# Patient Record
Sex: Female | Born: 1975 | Hispanic: Yes | Marital: Single
Health system: Southern US, Community
[De-identification: ages and names within clinical notes are randomized; demographics above are authoritative.]

---

## 2019-01-03 ENCOUNTER — Encounter: Payer: Self-pay | Admitting: *Deleted

## 2019-01-03 ENCOUNTER — Ambulatory Visit: Payer: Self-pay | Attending: Oncology | Admitting: *Deleted

## 2019-01-03 ENCOUNTER — Encounter (INDEPENDENT_AMBULATORY_CARE_PROVIDER_SITE_OTHER): Payer: Self-pay

## 2019-01-03 ENCOUNTER — Ambulatory Visit
Admission: RE | Admit: 2019-01-03 | Discharge: 2019-01-03 | Disposition: A | Discharge status: Home / Self Care | Payer: Self-pay | Source: Ambulatory Visit | Attending: Oncology | Admitting: Oncology

## 2019-01-03 VITALS — BP 121/80 | HR 66 | Temp 97.9°F | Ht 63.25 in | Wt 212.6 lb

## 2019-01-03 DIAGNOSIS — N63 Unspecified lump in unspecified breast: Secondary | ICD-10-CM

## 2019-01-03 NOTE — Patient Instructions (Signed)
Gave patient hand-out, Women Staying Healthy, Active and Well from [facility], with education on breast health, pap smears, heart and colon health. 

## 2019-01-03 NOTE — Progress Notes (Signed)
  Subjective:     Patient ID: Karen Baxter, female   DOB: November 20, 1976, 43 y.o.   MRN: [id number]  HPI   Review of Systems     Objective:   Physical Exam Chest:          Assessment:     43 year old Hispanic female referred to [facility] by [facility] for complaints of an enlarging right breast mass.  Karen Baxter, the interpreter present during the interview and exam.  On clinical breast exam there is glandular like tissue at bilateral inner lower quadrants.  I asked the patient to point the area of concern which was at 5:00 right breast at the edge of the inframammary ridge.  I can palpate soft mobile glandular like tissue at the site of concern.  Taught self breast awareness.  Patient states she had her repeat pap in November and it was normal.  Next pap due per [facility] guidelines.  Patient has been screened for eligibility.  She does not have any insurance, Medicare or Medicaid.  She also meets financial eligibility.  Hand-out given on the Affordable Care Act. Risk Assessment    Risk Scores      01/03/2019   Last edited by: Alta Corning, CMA   5-year risk: 0.4 %   Lifetime risk: 7.9 %            Plan:     Ordered bilateral diagnostic mammogram and ultrasound to assess the patient's area of concern at 5:00 right breast.  nformed patient that the area of concern felt like normal glandular tissue, and if no findings on imaging she can return in 1 year for annual screening, unless the area of concern gets larger or more painful, then she is to call us back and we will reassess her at that time.  Will follow-up per [facility] protocol.

## 2019-01-09 ENCOUNTER — Encounter: Payer: Self-pay | Admitting: *Deleted

## 2019-01-09 NOTE — Progress Notes (Signed)
Letter mailed from the [facility] to inform patient of her normal mammogram results.  Patient is to follow-up with annual screening in one year.  HSIS to [facility]. 

## 2019-05-23 ENCOUNTER — Telehealth: Payer: Self-pay

## 2019-05-23 ENCOUNTER — Other Ambulatory Visit: Payer: Self-pay

## 2019-05-23 DIAGNOSIS — Z20822 Contact with and (suspected) exposure to covid-19: Secondary | ICD-10-CM

## 2019-05-23 NOTE — Telephone Encounter (Signed)
Patient called using [facility] [phone], advised of the request for covid testing, she verbalized understanding. Appointment scheduled for today at 1500 at [facility], advised of location and to wear a mask for everyone in the vehicle, she verbalized understanding. Order placed.   Referred by Kerri Perches, PA at [place]

## 2019-05-30 LAB — NOVEL CORONAVIRUS, NAA

## 2020-03-07 ENCOUNTER — Ambulatory Visit: Payer: Self-pay | Attending: Internal Medicine

## 2020-03-07 DIAGNOSIS — Z23 Encounter for immunization: Secondary | ICD-10-CM

## 2020-03-07 NOTE — Progress Notes (Signed)
   Covid-19 Vaccination Clinic  Name:  Tyshawna Alarid    MRN: [id number] DOB: 1976/04/22  03/07/2020  Ms. Randell Detter was observed post Covid-19 immunization for 15 minutes without incident. She was provided with Vaccine Information Sheet and instruction to access the V-Safe system.   Ms. Darcia Lampi was instructed to call 911 with any severe reactions post vaccine: Marland Kitchen Difficulty breathing  . Swelling of face and throat  . A fast heartbeat  . A bad rash all over body  . Dizziness and weakness   Immunizations Administered    Name Date Dose VIS Date Route   Pfizer COVID-19 Vaccine 03/07/2020  5:08 PM 0.3 mL 11/23/2019 Intramuscular   Manufacturer: [employer], [employer]   Lot: [id number]   NDC: [address]-0

## 2020-03-28 ENCOUNTER — Ambulatory Visit: Payer: Self-pay | Attending: Internal Medicine

## 2020-03-28 DIAGNOSIS — Z23 Encounter for immunization: Secondary | ICD-10-CM

## 2020-03-28 NOTE — Progress Notes (Signed)
   Covid-19 Vaccination Clinic  Name:  Laguana Desautel    MRN: [id number] DOB: Apr 07, 1976  03/28/2020  Ms. Bayyinah Dukeman was observed post Covid-19 immunization for 15 minutes without incident. She was provided with Vaccine Information Sheet and instruction to access the V-Safe system.   Ms. Neeya Prigmore was instructed to call 911 with any severe reactions post vaccine: Marland Kitchen Difficulty breathing  . Swelling of face and throat  . A fast heartbeat  . A bad rash all over body  . Dizziness and weakness   Immunizations Administered    Name Date Dose VIS Date Route   Pfizer COVID-19 Vaccine 03/28/2020  3:06 PM 0.3 mL 11/23/2019 Intramuscular   Manufacturer: [employer], [employer]   Lot: [id number]   NDC: [address]-5      Covid-19 Vaccination Clinic  Name:  Laray Rivkin    MRN: [id number] DOB: 15-Aug-1976  03/28/2020  Ms. Rashunda Passon was observed post Covid-19 immunization for 15 minutes without incident. She was provided with Vaccine Information Sheet and instruction to access the V-Safe system.   Ms. Venna Berberich was instructed to call 911 with any severe reactions post vaccine: Marland Kitchen Difficulty breathing  . Swelling of face and throat  . A fast heartbeat  . A bad rash all over body  . Dizziness and weakness   Immunizations Administered    Name Date Dose VIS Date Route   Pfizer COVID-19 Vaccine 03/28/2020  3:06 PM 0.3 mL 11/23/2019 Intramuscular   Manufacturer: [employer], [employer]   Lot: [id number]   NDC: [address]-4

## 2020-06-14 IMAGING — MG DIGITAL DIAGNOSTIC BILATERAL MAMMOGRAM WITH TOMO AND CAD
6 of 10 series · 6 of 30 positions shown · non-contrast
Comparison: None.

CLINICAL DATA: Patient presents for bilateral diagnostic
examination due to a palpable abnormality for 1 year over the inner
lower quadrant of the right breast. This is patient's baseline
mammogram.

EXAM:
DIGITAL DIAGNOSTIC bilateral MAMMOGRAM WITH CAD AND TOMO
ULTRASOUND right BREAST

[R MLO synth-2D]
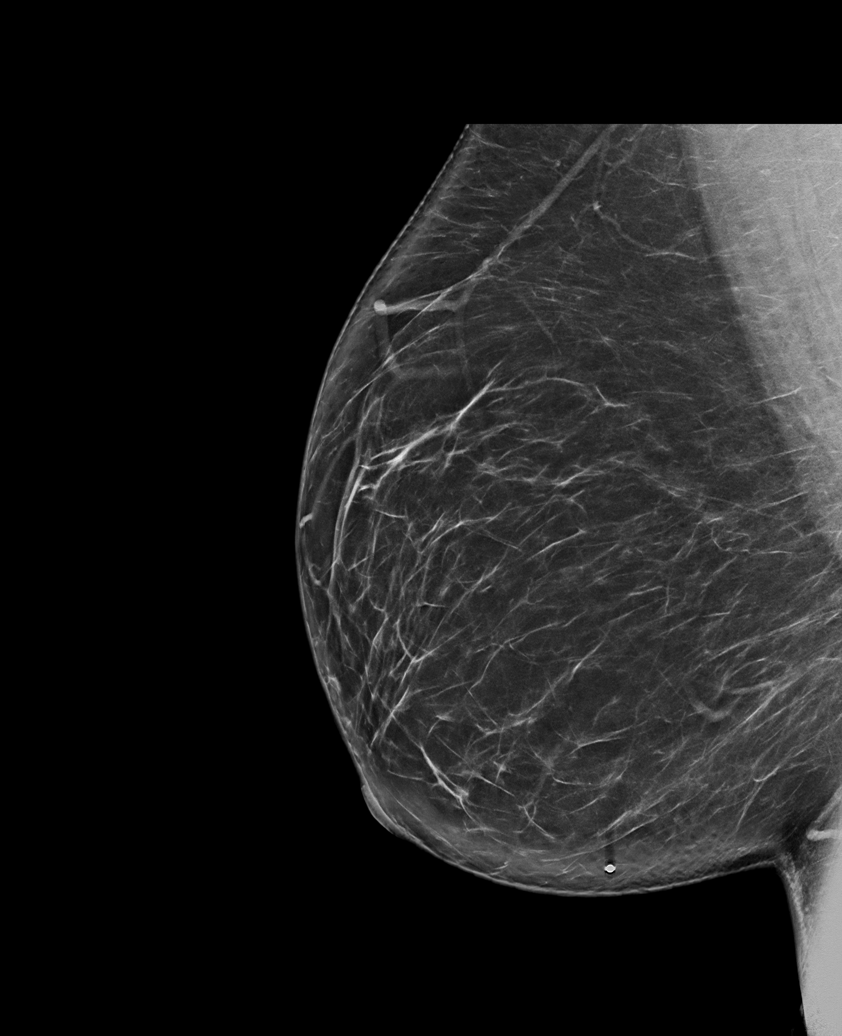

[R CC synth-2D]
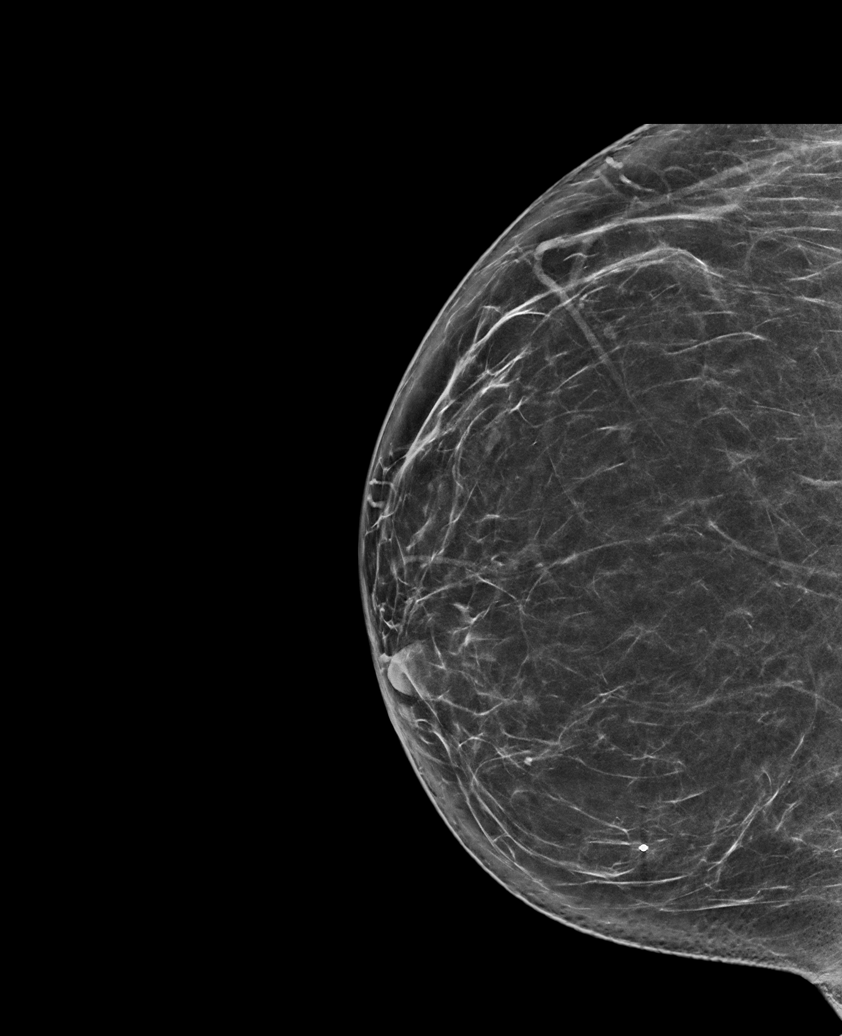

[R TAN synth-2D]
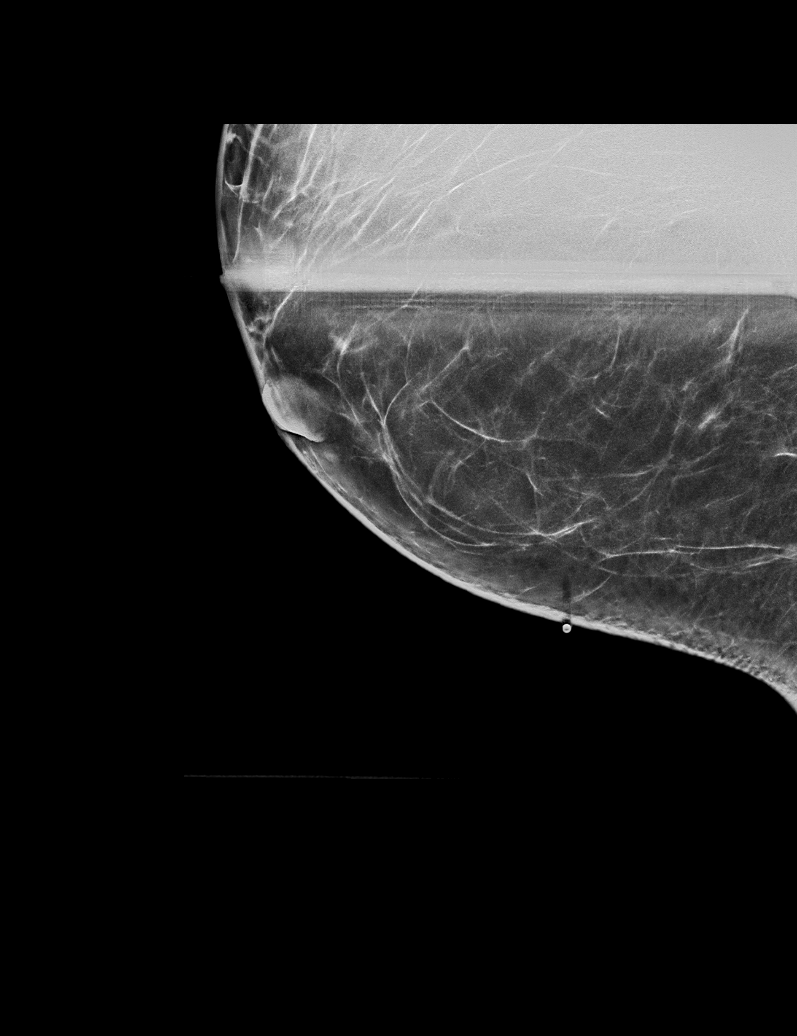

[L CC synth-2D]
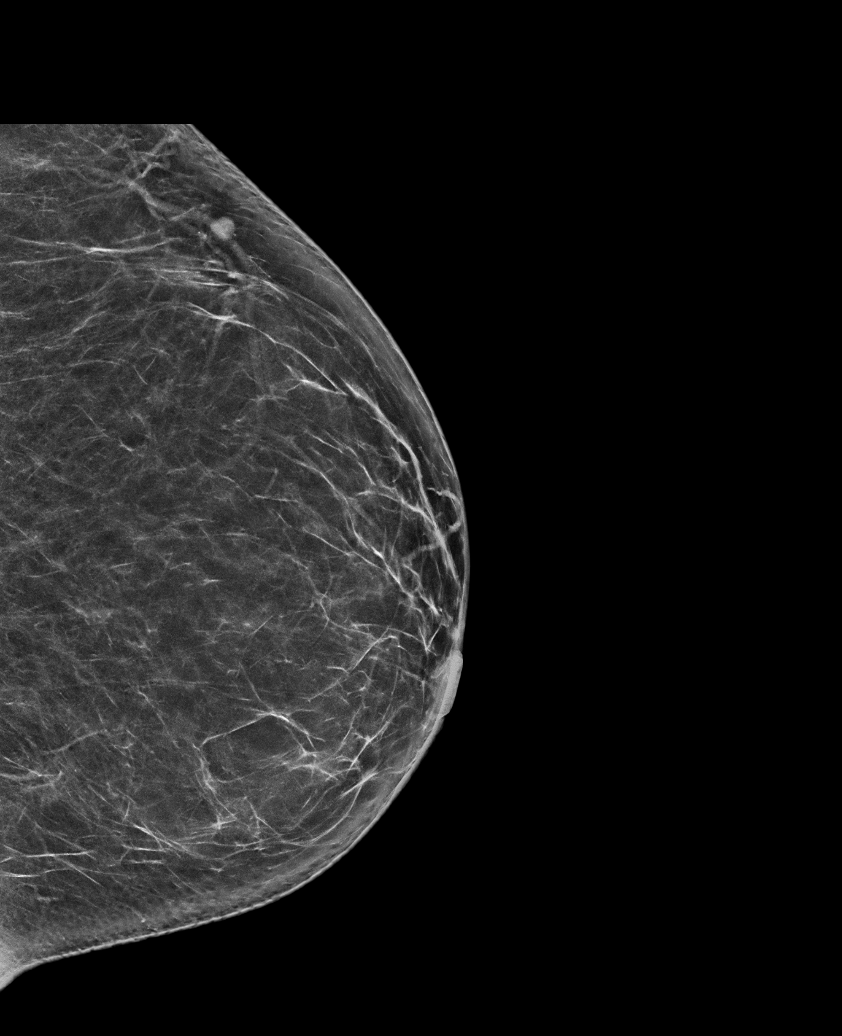

[L MLO synth-2D]
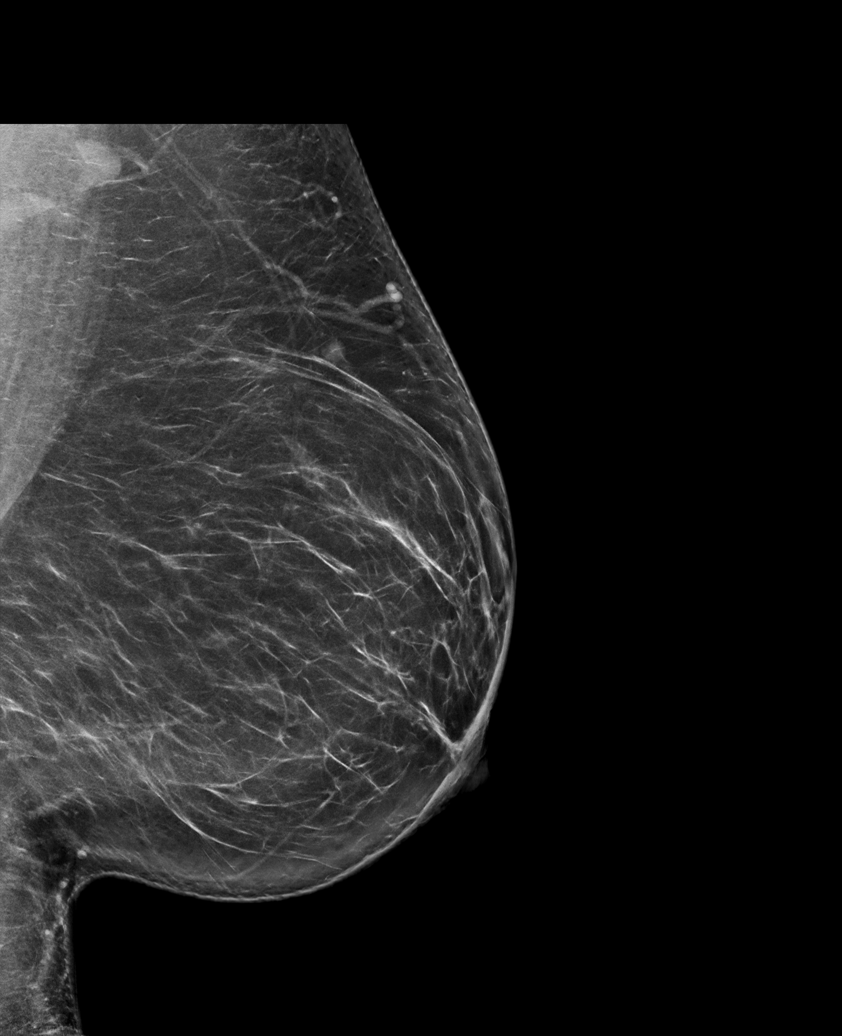

[L MLO tomo · tomo slice 46/91.0]
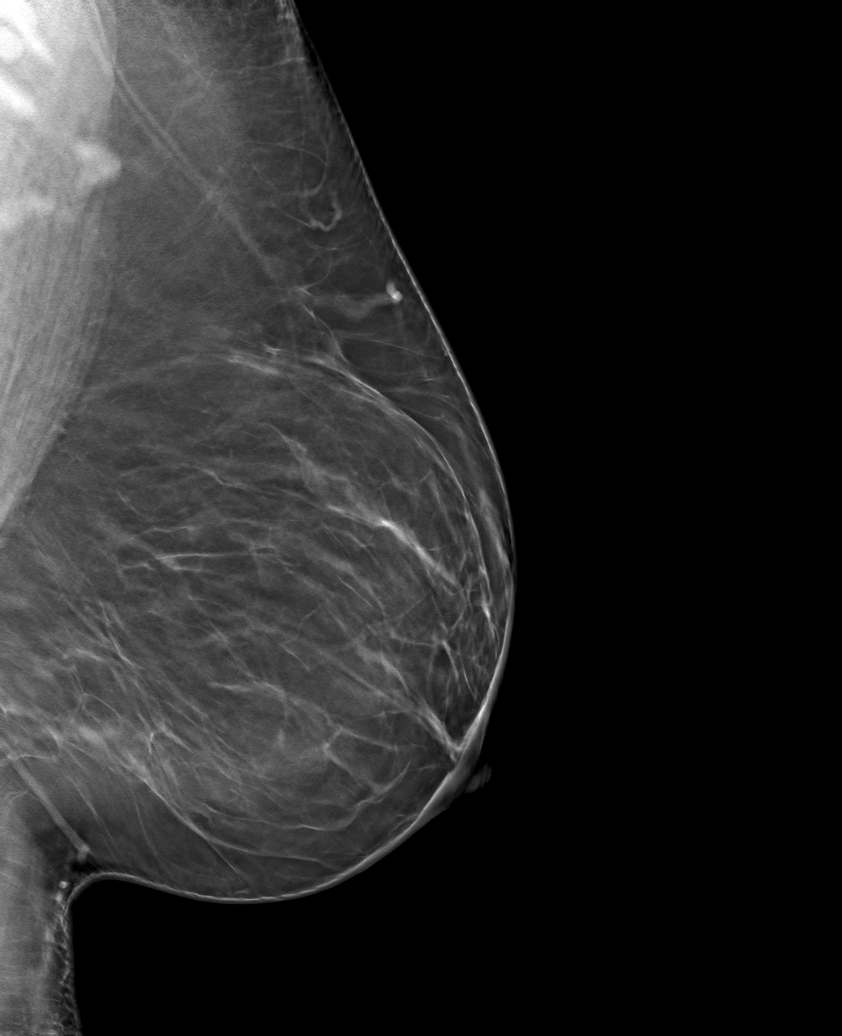

[6 of 30 positions shown; findings below may reference images not displayed]

ACR Breast Density Category b: There are scattered areas of
fibroglandular density.
FINDINGS: Exam demonstrates no focal abnormality over the inner lower quadrant
of the right breast to account for patient's palpable abnormality.
There is a small intramammary lymph node over the upper-outer left
breast. Remainder of the right breast as well as the left breast is
unremarkable.

Mammographic images were processed with CAD.

On physical exam, I palpate no focal abnormality over the inner
lower quadrant of the right breast.

Targeted ultrasound is performed, showing no focal abnormality over
the inner lower quadrant of the right breast to account for
patient's palpable abnormality.
IMPRESSION: No focal abnormality over the inner lower quadrant of the right
breast to account for patient's palpable abnormality. Remainder of
the exam is normal.

RECOMMENDATION:
Recommend continued management of patient's right breast palpable
abnormality on a clinical basis. Otherwise, recommend continued
annual bilateral screening mammographic follow-up.

I have discussed the findings and recommendations with the patient.
Results were also provided in writing at the conclusion of the
visit. If applicable, a reminder letter will be sent to the patient
regarding the next appointment.

BI-RADS CATEGORY  2: Benign.

## 2022-11-01 ENCOUNTER — Other Ambulatory Visit: Payer: Self-pay | Admitting: Family Medicine

## 2022-11-01 DIAGNOSIS — M25512 Pain in left shoulder: Secondary | ICD-10-CM

## 2022-11-03 ENCOUNTER — Ambulatory Visit
Admission: RE | Admit: 2022-11-03 | Discharge: 2022-11-03 | Disposition: A | Discharge status: Home / Self Care | Payer: Commercial Managed Care - HMO | Source: Ambulatory Visit | Attending: Family Medicine | Admitting: Family Medicine

## 2022-11-03 ENCOUNTER — Ambulatory Visit: Admission: RE | Admit: 2022-11-03 | Payer: Commercial Managed Care - HMO | Source: Ambulatory Visit

## 2022-11-03 DIAGNOSIS — M25512 Pain in left shoulder: Secondary | ICD-10-CM | POA: Insufficient documentation

## 2023-09-15 ENCOUNTER — Encounter: Payer: Self-pay | Admitting: Family Medicine

## 2023-11-16 ENCOUNTER — Other Ambulatory Visit: Payer: Self-pay

## 2023-11-16 DIAGNOSIS — Z1231 Encounter for screening mammogram for malignant neoplasm of breast: Secondary | ICD-10-CM

## 2023-11-28 ENCOUNTER — Other Ambulatory Visit: Payer: Self-pay | Admitting: Family Medicine

## 2023-11-28 ENCOUNTER — Ambulatory Visit
Admission: RE | Admit: 2023-11-28 | Discharge: 2023-11-28 | Disposition: A | Discharge status: Home / Self Care | Payer: Self-pay | Source: Ambulatory Visit | Attending: Family Medicine | Admitting: Family Medicine

## 2023-11-28 ENCOUNTER — Ambulatory Visit: Payer: Self-pay | Attending: Hematology and Oncology

## 2023-11-28 DIAGNOSIS — Z1231 Encounter for screening mammogram for malignant neoplasm of breast: Secondary | ICD-10-CM
